# Patient Record
Sex: Male | Born: 1975 | Race: White | Hispanic: No | Marital: Married | Smoking: Former smoker
Health system: Southern US, Community
[De-identification: ages and names within clinical notes are randomized; demographics above are authoritative.]

## PROBLEM LIST (undated history)

## (undated) DIAGNOSIS — F419 Anxiety disorder, unspecified: Secondary | ICD-10-CM

## (undated) HISTORY — PX: WISDOM TOOTH EXTRACTION: SHX21

---

## 2019-10-15 ENCOUNTER — Ambulatory Visit: Admission: EM | Admit: 2019-10-15 | Disposition: A | Discharge status: Home / Self Care | Payer: Self-pay

## 2019-10-15 ENCOUNTER — Ambulatory Visit
Admission: EM | Admit: 2019-10-15 | Discharge: 2019-10-15 | Disposition: A | Discharge status: Home / Self Care | Payer: Federal, State, Local not specified - PPO

## 2019-10-15 DIAGNOSIS — T148XXA Other injury of unspecified body region, initial encounter: Secondary | ICD-10-CM

## 2019-10-15 DIAGNOSIS — S01112A Laceration without foreign body of left eyelid and periocular area, initial encounter: Secondary | ICD-10-CM | POA: Diagnosis not present

## 2019-10-15 DIAGNOSIS — L089 Local infection of the skin and subcutaneous tissue, unspecified: Secondary | ICD-10-CM

## 2019-10-15 HISTORY — DX: Anxiety disorder, unspecified: F41.9

## 2019-10-15 MED ORDER — CEPHALEXIN 500 MG PO CAPS: 500.0000 mg | ORAL_CAPSULE | Freq: Four times a day (QID) | ORAL | 0 refills | Status: AC

## 2019-10-15 NOTE — Discharge Instructions (Addendum)
Take the antibiotic as directed.    Keep your wound clean and dry.  Wash it gently twice a day with soap and water.      Follow up with your primary care provider or return here if you see signs of infection, such as increased pain, redness, pus-like drainage, warmth, fever, chills, or other concerning symptoms.

## 2019-10-15 NOTE — ED Triage Notes (Signed)
Patient reports he was walking into the bathroom at home early Sunday morning when he tripped and hit his left eye on the bathtub.

## 2019-10-15 NOTE — ED Provider Notes (Signed)
Joel Barr    CSN: [id number] Arrival date & time: 10/15/19  1045      History   Chief Complaint Chief Complaint  Patient presents with  . Eye Injury    HPI Joel Barr is a 44 y.o. male.   Patient presents with a laceration over his left eyebrow which occurred on 10/13/2019.  He states he got up in the night to go to the bathroom, tripped, fell and cut his eyebrow.  He denies dizziness, numbness, weakness, headache, changes in vision, eye pain, or other symptoms.  Treatment at home with OTC Steri-Strips applied this morning.  Tetanus UTD.   The history is provided by the patient.    Past Medical History:  Diagnosis Date  . Anxiety     There are no problems to display for this patient.   Past Surgical History:  Procedure Laterality Date  . WISDOM TOOTH EXTRACTION         Home Medications    Prior to Admission medications   Medication Sig Start Date End Date Taking? Authorizing Provider  citalopram (CELEXA) 20 MG tablet Take 20 mg by mouth daily.   Yes [provider]  cephALEXin (KEFLEX) 500 MG capsule Take 1 capsule (500 mg total) by mouth 4 (four) times daily for 7 days. 10/15/19 10/22/19  Mickie Bail, NP    Family History History reviewed. No pertinent family history.  Social History Social History   Tobacco Use  . Smoking status: Former Games developer  . Smokeless tobacco: Former Clinical biochemist  . Vaping Use: Never used  Substance Use Topics  . Alcohol use: Yes    Comment: occasionally  . Drug use: Not on file     Allergies   Patient has no known allergies.   Review of Systems Review of Systems  Constitutional: Negative for chills and fever.  HENT: Negative for ear pain and sore throat.   Eyes: Negative for pain and visual disturbance.  Respiratory: Negative for cough and shortness of breath.   Cardiovascular: Negative for chest pain and palpitations.  Gastrointestinal: Negative for abdominal pain and vomiting.    Genitourinary: Negative for dysuria and hematuria.  Musculoskeletal: Negative for arthralgias and back pain.  Skin: Positive for wound. Negative for color change and rash.  Neurological: Negative for seizures, syncope, weakness and numbness.  All other systems reviewed and are negative.    Physical Exam Triage Vital Signs ED Triage Vitals  Enc Vitals Group     BP 10/15/19 1230 126/87     Pulse Rate 10/15/19 1230 72     Resp 10/15/19 1230 14     Temp 10/15/19 1230 98.2 F (36.8 C)     Temp src --      SpO2 10/15/19 1230 97 %     Weight --      Height --      Head Circumference --      Peak Flow --      Pain Score 10/15/19 1227 1     Pain Loc --      Pain Edu? --      Excl. in GC? --    No data found.  Updated Vital Signs BP 126/87   Pulse 72   Temp 98.2 F (36.8 C)   Resp 14   SpO2 97%   Visual Acuity Right Eye Distance:   Left Eye Distance:   Bilateral Distance:    Right Eye Near:   Left Eye Near:  Bilateral Near:     Physical Exam Vitals and nursing note reviewed.  Constitutional:      General: He is not in acute distress.    Appearance: He is well-developed.  HENT:     Head: Normocephalic and atraumatic.     Mouth/Throat:     Mouth: Mucous membranes are moist.  Eyes:     Extraocular Movements: Extraocular movements intact.     Conjunctiva/sclera: Conjunctivae normal.     Pupils: Pupils are equal, round, and reactive to light.   Cardiovascular:     Rate and Rhythm: Normal rate and regular rhythm.     Heart sounds: No murmur heard.   Pulmonary:     Effort: Pulmonary effort is normal. No respiratory distress.     Breath sounds: Normal breath sounds.  Abdominal:     Palpations: Abdomen is soft.     Tenderness: There is no abdominal tenderness.  Musculoskeletal:        General: Normal range of motion.     Cervical back: Neck supple.  Skin:    General: Skin is warm and dry.     Findings: Bruising and lesion present.     Comments: Bruising  beneath left eye.   Neurological:     General: No focal deficit present.     Mental Status: He is alert and oriented to person, place, and time.     Cranial Nerves: No cranial nerve deficit.     Sensory: No sensory deficit.     Motor: No weakness.     Gait: Gait normal.  Psychiatric:        Mood and Affect: Mood normal.        Behavior: Behavior normal.      UC Treatments / Results  Labs (all labs ordered are listed, but only abnormal results are displayed) Labs Reviewed - No data to display  EKG   Radiology No results found.  Procedures Laceration Repair  Date/Time: 10/15/2019 1:01 PM Performed by: Mickie Bail, NP Authorized by: Mickie Bail, NP   Consent:    Consent obtained:  Verbal   Consent given by:  Patient   Risks discussed:  Infection, poor wound healing and poor cosmetic result Anesthesia (see MAR for exact dosages):    Anesthesia method:  None Laceration details:    Location:  Face   Face location:  L eyebrow   Length (cm):  2   Depth (mm):  3 Repair type:    Repair type:  Simple Exploration:    Wound exploration: entire depth of wound probed and visualized   Treatment:    Area cleansed with:  Saline   Irrigation solution:  Sterile saline   Irrigation method:  Syringe   Visualized foreign bodies/material removed: no   Skin repair:    Repair method:  Steri-Strips   Number of Steri-Strips:  3 Approximation:    Approximation:  Close Post-procedure details:    Dressing:  Open (no dressing)   Patient tolerance of procedure:  Tolerated well, no immediate complications Comments:     Small amount of purulent drainage from wound.    (including critical care time)  Medications Ordered in UC Medications - No data to display  Initial Impression / Assessment and Plan / UC Course  I have reviewed the triage vital signs and the nursing notes.  Pertinent labs & imaging results that were available during my care of the patient were reviewed by me and  considered in my medical decision making (  see chart for details).   Laceration of the left eyebrow.  Wound infection.  Wound cleaned and Steri-Strips applied.  Treating with Keflex.  Wound care instructions and signs of worsening infection discussed with patient.  Instructed patient to return here or follow-up with his PCP if he notes signs of worsening infection.  Tetanus up-to-date.  Patient agrees to plan of care.   Final Clinical Impressions(s) / UC Diagnoses   Final diagnoses:  Laceration of left eyebrow, initial encounter  Wound infection     Discharge Instructions     Take the antibiotic as directed.    Keep your wound clean and dry.  Wash it gently twice a day with soap and water.      Follow up with your primary care provider or return here if you see signs of infection, such as increased pain, redness, pus-like drainage, warmth, fever, chills, or other concerning symptoms.       ED Prescriptions    Medication Sig Dispense Auth. Provider   cephALEXin (KEFLEX) 500 MG capsule Take 1 capsule (500 mg total) by mouth 4 (four) times daily for 7 days. 28 capsule Mickie Bail, NP     PDMP not reviewed this encounter.   Mickie Bail, NP 10/15/19 1313

## 2020-03-02 ENCOUNTER — Other Ambulatory Visit: Payer: Federal, State, Local not specified - PPO

## 2020-05-25 ENCOUNTER — Encounter
Admission: EM | Disposition: A | Discharge status: Home / Self Care | Payer: Self-pay | Source: Home / Self Care | Attending: Emergency Medicine

## 2020-05-25 ENCOUNTER — Observation Stay: Payer: Federal, State, Local not specified - PPO | Admitting: Anesthesiology

## 2020-05-25 ENCOUNTER — Other Ambulatory Visit: Payer: Self-pay

## 2020-05-25 ENCOUNTER — Observation Stay
Admission: EM | Admit: 2020-05-25 | Discharge: 2020-05-26 | Disposition: A | Discharge status: Home / Self Care | Payer: Federal, State, Local not specified - PPO | Attending: General Surgery | Admitting: General Surgery

## 2020-05-25 ENCOUNTER — Emergency Department: Payer: Federal, State, Local not specified - PPO

## 2020-05-25 DIAGNOSIS — Z87891 Personal history of nicotine dependence: Secondary | ICD-10-CM | POA: Insufficient documentation

## 2020-05-25 DIAGNOSIS — F1010 Alcohol abuse, uncomplicated: Secondary | ICD-10-CM

## 2020-05-25 DIAGNOSIS — Z79899 Other long term (current) drug therapy: Secondary | ICD-10-CM | POA: Insufficient documentation

## 2020-05-25 DIAGNOSIS — R1031 Right lower quadrant pain: Secondary | ICD-10-CM | POA: Diagnosis present

## 2020-05-25 DIAGNOSIS — K353 Acute appendicitis with localized peritonitis, without perforation or gangrene: Secondary | ICD-10-CM | POA: Diagnosis not present

## 2020-05-25 DIAGNOSIS — Z20822 Contact with and (suspected) exposure to covid-19: Secondary | ICD-10-CM | POA: Insufficient documentation

## 2020-05-25 HISTORY — PX: XI ROBOTIC LAPAROSCOPIC ASSISTED APPENDECTOMY: SHX6877

## 2020-05-25 LAB — URINALYSIS, COMPLETE (UACMP) WITH MICROSCOPIC
Bacteria, UA: NONE SEEN
Bilirubin Urine: NEGATIVE
Glucose, UA: NEGATIVE mg/dL
Hgb urine dipstick: NEGATIVE
Ketones, ur: NEGATIVE mg/dL
Leukocytes,Ua: NEGATIVE
Nitrite: NEGATIVE
Protein, ur: NEGATIVE mg/dL
Specific Gravity, Urine: 1.03 (ref 1.005–1.030)
Squamous Epithelial / HPF: NONE SEEN (ref 0–5)
pH: 5 (ref 5.0–8.0)

## 2020-05-25 LAB — CBC
HCT: 41.7 % (ref 39.0–52.0)
Hemoglobin: 13.9 g/dL (ref 13.0–17.0)
MCH: 32 pg (ref 26.0–34.0)
MCHC: 33.3 g/dL (ref 30.0–36.0)
MCV: 96.1 fL (ref 80.0–100.0)
Platelets: 175 10*3/uL (ref 150–400)
RBC: 4.34 MIL/uL (ref 4.22–5.81)
RDW: 12.4 % (ref 11.5–15.5)
WBC: 10.4 10*3/uL (ref 4.0–10.5)
nRBC: 0 % (ref 0.0–0.2)

## 2020-05-25 LAB — COMPREHENSIVE METABOLIC PANEL
ALT: 22 U/L (ref 0–44)
AST: 26 U/L (ref 15–41)
Albumin: 4.1 g/dL (ref 3.5–5.0)
Alkaline Phosphatase: 53 U/L (ref 38–126)
Anion gap: 10 (ref 5–15)
BUN: 15 mg/dL (ref 6–20)
CO2: 26 mmol/L (ref 22–32)
Calcium: 8.8 mg/dL — ABNORMAL LOW (ref 8.9–10.3)
Chloride: 101 mmol/L (ref 98–111)
Creatinine, Ser: 0.91 mg/dL (ref 0.61–1.24)
GFR, Estimated: >60 mL/min (ref >60)
Glucose, Bld: 116 mg/dL — ABNORMAL HIGH (ref 70–99)
Potassium: 3.8 mmol/L (ref 3.5–5.1)
Sodium: 137 mmol/L (ref 135–145)
Total Bilirubin: 0.6 mg/dL (ref 0.3–1.2)
Total Protein: 7 g/dL (ref 6.5–8.1)

## 2020-05-25 LAB — LIPASE, BLOOD: Lipase: 29 U/L (ref 11–51)

## 2020-05-25 LAB — RESP PANEL BY RT-PCR (FLU A&B, COVID) ARPGX2
Influenza A by PCR: NEGATIVE
Influenza B by PCR: NEGATIVE
SARS Coronavirus 2 by RT PCR: NEGATIVE

## 2020-05-25 SURGERY — APPENDECTOMY, ROBOT-ASSISTED, LAPAROSCOPIC
Anesthesia: General

## 2020-05-25 MED ORDER — PIPERACILLIN-TAZOBACTAM 3.375 G IVPB
3.3750 g | Freq: Three times a day (TID) | INTRAVENOUS | Status: DC
  Administered 2020-05-25 – 2020-05-26 (×3): 3.375 g via INTRAVENOUS
  Filled 2020-05-25 (×3): qty 50

## 2020-05-25 MED ORDER — SODIUM CHLORIDE 0.9 % IV SOLN: Freq: Once | INTRAVENOUS | Status: DC

## 2020-05-25 MED ORDER — MIDAZOLAM HCL 2 MG/2ML IJ SOLN
INTRAMUSCULAR | Status: AC
  Filled 2020-05-25: qty 2

## 2020-05-25 MED ORDER — CITALOPRAM HYDROBROMIDE 20 MG PO TABS
20.0000 mg | ORAL_TABLET | Freq: Every day | ORAL | Status: DC
  Administered 2020-05-26: 20 mg via ORAL
  Filled 2020-05-25: qty 1

## 2020-05-25 MED ORDER — PROPOFOL 10 MG/ML IV BOLUS
INTRAVENOUS | Status: AC
  Filled 2020-05-25: qty 20

## 2020-05-25 MED ORDER — ONDANSETRON HCL 4 MG/2ML IJ SOLN: 4.0000 mg | Freq: Four times a day (QID) | INTRAMUSCULAR | Status: DC | PRN

## 2020-05-25 MED ORDER — MIDAZOLAM HCL 2 MG/2ML IJ SOLN
INTRAMUSCULAR | Status: DC | PRN
  Administered 2020-05-25: 2 mg via INTRAVENOUS

## 2020-05-25 MED ORDER — MORPHINE SULFATE (PF) 4 MG/ML IV SOLN
4.0000 mg | INTRAVENOUS | Status: DC | PRN
  Administered 2020-05-25: 4 mg via INTRAVENOUS
  Filled 2020-05-25: qty 1

## 2020-05-25 MED ORDER — HYDROCODONE-ACETAMINOPHEN 5-325 MG PO TABS
1.0000 | ORAL_TABLET | ORAL | Status: DC | PRN
  Administered 2020-05-26: 1 via ORAL
  Administered 2020-05-26: 2 via ORAL
  Filled 2020-05-25: qty 2
  Filled 2020-05-25: qty 1

## 2020-05-25 MED ORDER — SODIUM CHLORIDE 0.9 % IV SOLN: INTRAVENOUS | Status: DC

## 2020-05-25 MED ORDER — ONDANSETRON HCL 4 MG/2ML IJ SOLN
4.0000 mg | Freq: Once | INTRAMUSCULAR | Status: AC
  Administered 2020-05-25: 4 mg via INTRAVENOUS
  Filled 2020-05-25: qty 2

## 2020-05-25 MED ORDER — FENTANYL CITRATE (PF) 100 MCG/2ML IJ SOLN
INTRAMUSCULAR | Status: AC
  Filled 2020-05-25: qty 2

## 2020-05-25 MED ORDER — PANTOPRAZOLE SODIUM 40 MG IV SOLR: 40.0000 mg | Freq: Every day | INTRAVENOUS | Status: DC

## 2020-05-25 MED ORDER — MORPHINE SULFATE (PF) 4 MG/ML IV SOLN
4.0000 mg | Freq: Once | INTRAVENOUS | Status: AC
  Administered 2020-05-25: 4 mg via INTRAVENOUS
  Filled 2020-05-25: qty 1

## 2020-05-25 MED ORDER — IOHEXOL 300 MG/ML  SOLN
100.0000 mL | Freq: Once | INTRAMUSCULAR | Status: AC | PRN
  Administered 2020-05-25: 100 mL via INTRAVENOUS

## 2020-05-25 MED ORDER — LACTATED RINGERS IV BOLUS
1000.0000 mL | Freq: Once | INTRAVENOUS | Status: AC
  Administered 2020-05-25: 1000 mL via INTRAVENOUS

## 2020-05-25 MED ORDER — ACETAMINOPHEN 325 MG PO TABS: 650.0000 mg | ORAL_TABLET | Freq: Four times a day (QID) | ORAL | Status: DC | PRN

## 2020-05-25 MED ORDER — BUPIVACAINE-EPINEPHRINE (PF) 0.5% -1:200000 IJ SOLN
INTRAMUSCULAR | Status: AC
  Filled 2020-05-25: qty 30

## 2020-05-25 MED ORDER — ONDANSETRON 4 MG PO TBDP: 4.0000 mg | ORAL_TABLET | Freq: Four times a day (QID) | ORAL | Status: DC | PRN

## 2020-05-25 MED ORDER — ACETAMINOPHEN 650 MG RE SUPP: 650.0000 mg | Freq: Four times a day (QID) | RECTAL | Status: DC | PRN

## 2020-05-25 MED ORDER — ENOXAPARIN SODIUM 40 MG/0.4ML ~~LOC~~ SOLN
40.0000 mg | SUBCUTANEOUS | Status: DC
  Administered 2020-05-26: 40 mg via SUBCUTANEOUS
  Filled 2020-05-25: qty 0.4

## 2020-05-25 SURGICAL SUPPLY — 64 items
BAG INFUSER PRESSURE 100CC (MISCELLANEOUS) IMPLANT
BLADE SURG SZ11 CARB STEEL (BLADE) ×2 IMPLANT
CANISTER SUCT 1200ML W/VALVE (MISCELLANEOUS) IMPLANT
CANNULA REDUC XI 12-8 STAPL (CANNULA) ×1
CANNULA REDUCER 12-8 DVNC XI (CANNULA) ×1 IMPLANT
CHLORAPREP W/TINT 26 (MISCELLANEOUS) ×2 IMPLANT
COVER TIP SHEARS 8 DVNC (MISCELLANEOUS) ×1 IMPLANT
COVER TIP SHEARS 8MM DA VINCI (MISCELLANEOUS) ×1
COVER WAND RF STERILE (DRAPES) ×2 IMPLANT
DEFOGGER SCOPE WARMER CLEARIFY (MISCELLANEOUS) ×2 IMPLANT
DERMABOND ADVANCED (GAUZE/BANDAGES/DRESSINGS) ×1
DERMABOND ADVANCED .7 DNX12 (GAUZE/BANDAGES/DRESSINGS) ×1 IMPLANT
DRAPE ARM DVNC X/XI (DISPOSABLE) ×4 IMPLANT
DRAPE COLUMN DVNC XI (DISPOSABLE) ×1 IMPLANT
DRAPE DA VINCI XI ARM (DISPOSABLE) ×4
DRAPE DA VINCI XI COLUMN (DISPOSABLE) ×1
ELECT REM PT RETURN 9FT ADLT (ELECTROSURGICAL) ×2
ELECTRODE REM PT RTRN 9FT ADLT (ELECTROSURGICAL) ×1 IMPLANT
GLOVE SURG SYN 6.5 ES PF (GLOVE) ×4 IMPLANT
GLOVE SURG UNDER POLY LF SZ7 (GLOVE) ×4 IMPLANT
GOWN STRL REUS W/ TWL LRG LVL3 (GOWN DISPOSABLE) ×3 IMPLANT
GOWN STRL REUS W/TWL LRG LVL3 (GOWN DISPOSABLE) ×3
GRASPER SUT TROCAR 14GX15 (MISCELLANEOUS) ×2 IMPLANT
IRRIGATOR SUCT 8 DISP DVNC XI (IRRIGATION / IRRIGATOR) IMPLANT
IRRIGATOR SUCTION 8MM XI DISP (IRRIGATION / IRRIGATOR)
IV NS 1000ML (IV SOLUTION)
IV NS 1000ML BAXH (IV SOLUTION) IMPLANT
KIT PINK PAD W/HEAD ARE REST (MISCELLANEOUS) ×2
KIT PINK PAD W/HEAD ARM REST (MISCELLANEOUS) ×1 IMPLANT
LABEL OR SOLS (LABEL) IMPLANT
MANIFOLD NEPTUNE II (INSTRUMENTS) ×2 IMPLANT
NEEDLE HYPO 22GX1.5 SAFETY (NEEDLE) ×2 IMPLANT
NEEDLE INSUFFLATION 14GA 120MM (NEEDLE) ×2 IMPLANT
OBTURATOR OPTICAL STANDARD 8MM (TROCAR) ×1
OBTURATOR OPTICAL STND 8 DVNC (TROCAR) ×1
OBTURATOR OPTICALSTD 8 DVNC (TROCAR) ×1 IMPLANT
PACK LAP CHOLECYSTECTOMY (MISCELLANEOUS) ×2 IMPLANT
POUCH SPECIMEN RETRIEVAL 10MM (ENDOMECHANICALS) ×2 IMPLANT
RELOAD STAPLER 2.5X45 WHT DVNC (STAPLE) IMPLANT
RELOAD STAPLER 3.5X45 BLU DVNC (STAPLE) IMPLANT
SEAL CANN UNIV 5-8 DVNC XI (MISCELLANEOUS) ×3 IMPLANT
SEAL XI 5MM-8MM UNIVERSAL (MISCELLANEOUS) ×3
SEALER VESSEL DA VINCI XI (MISCELLANEOUS)
SEALER VESSEL EXT DVNC XI (MISCELLANEOUS) IMPLANT
SET TUBE SMOKE EVAC HIGH FLOW (TUBING) ×2 IMPLANT
SLEEVE SCD COMPRESS KNEE LRG (STOCKING) ×2 IMPLANT
SOLUTION ELECTROLUBE (MISCELLANEOUS) ×2 IMPLANT
STAPLER 45 DA VINCI SURE FORM (STAPLE)
STAPLER 45 SUREFORM DVNC (STAPLE) IMPLANT
STAPLER CANNULA SEAL DVNC XI (STAPLE) IMPLANT
STAPLER CANNULA SEAL XI (STAPLE)
STAPLER RELOAD 2.5X45 WHITE (STAPLE)
STAPLER RELOAD 2.5X45 WHT DVNC (STAPLE)
STAPLER RELOAD 3.5X45 BLU DVNC (STAPLE)
STAPLER RELOAD 3.5X45 BLUE (STAPLE)
SUT MNCRL AB 4-0 PS2 18 (SUTURE) ×2 IMPLANT
SUT VIC AB 2-0 SH 27 (SUTURE) ×1
SUT VIC AB 2-0 SH 27XBRD (SUTURE) ×1 IMPLANT
SUT VIC AB 3-0 SH 27 (SUTURE) ×1
SUT VIC AB 3-0 SH 27X BRD (SUTURE) ×1 IMPLANT
SUT VICRYL 0 AB UR-6 (SUTURE) ×2 IMPLANT
SUT VLOC 90 6 CV-15 VIOLET (SUTURE) ×2 IMPLANT
SYR 30ML LL (SYRINGE) ×2 IMPLANT
TRAY FOLEY MTR SLVR 16FR STAT (SET/KITS/TRAYS/PACK) IMPLANT

## 2020-05-25 NOTE — ED Notes (Signed)
Artelia Laroche RN given report.

## 2020-05-25 NOTE — ED Notes (Signed)
Pt signed consent with this RN as witness. Pt told to remove anything metal, all personal clothing, jewelry, and is in a gown as requested by OR/surgery staff during report. Visitor remains at bedside.

## 2020-05-25 NOTE — H&P (Signed)
SURGICAL CONSULTATION NOTE   HISTORY OF PRESENT ILLNESS (HPI):  45 y.o. male presented to [facility] ED for evaluation of abdominal pain since today at 4 PM. Patient reports started with abdominal pain that localized to the right lower quadrant. Pain does not radiates to other part of the body. Pain aggravated by applying pressure on the right lower quadrant. No alleviating factors. Denies fever.  At the ED he was found with normal WBC count. CT scan of the abdomen shows inflamed appendix. No free air. No abscess. I personally evaluated the images.   Surgery is consulted by Dr. Katrinka Blazing in this context for evaluation and management of acute appendicitis.  PAST MEDICAL HISTORY (PMH):  Past Medical History:  Diagnosis Date  . Anxiety      PAST SURGICAL HISTORY (PSH):  Past Surgical History:  Procedure Laterality Date  . WISDOM TOOTH EXTRACTION       MEDICATIONS:  Prior to Admission medications   Medication Sig Start Date End Date Taking? Authorizing Provider  citalopram (CELEXA) 20 MG tablet Take 20 mg by mouth daily.   Yes [provider]     ALLERGIES:  No Known Allergies   SOCIAL HISTORY:  Social History   Socioeconomic History  . Marital status: Married    Spouse name: Not on file  . Number of children: Not on file  . Years of education: Not on file  . Highest education level: Not on file  Occupational History  . Not on file  Tobacco Use  . Smoking status: Former Games developer  . Smokeless tobacco: Former Clinical biochemist  . Vaping Use: Never used  Substance and Sexual Activity  . Alcohol use: Yes    Comment: occasionally  . Drug use: Not on file  . Sexual activity: Not on file  Other Topics Concern  . Not on file  Social History Narrative  . Not on file   Social Determinants of Health   Financial Resource Strain: Not on file  Food Insecurity: Not on file  Transportation Needs: Not on file  Physical Activity: Not on file  Stress: Not on file  Social  Connections: Not on file  Intimate Partner Violence: Not on file      FAMILY HISTORY:  No family history on file.   REVIEW OF SYSTEMS:  Constitutional: denies weight loss, fever, chills, or sweats  Eyes: denies any other vision changes, history of eye injury  ENT: denies sore throat, hearing problems  Respiratory: denies shortness of breath, wheezing  Cardiovascular: denies chest pain, palpitations  Gastrointestinal: positive abdominal pain, negative nausea and vomiting Genitourinary: denies burning with urination or urinary frequency Musculoskeletal: denies any other joint pains or cramps  Skin: denies any other rashes or skin discolorations  Neurological: denies any other headache, dizziness, weakness  Psychiatric: denies any other depression, anxiety   All other review of systems were negative   VITAL SIGNS:  Temp:  [98.3 F (36.8 C)] 98.3 F (36.8 C) (04/18 1933) Pulse Rate:  [74-81] 74 (04/18 2115) Resp:  [16] 16 (04/18 1933) BP: (136-152)/(78-88) 136/78 (04/18 2030) SpO2:  [96 %-100 %] 99 % (04/18 2115) Weight:  [99.8 kg] 99.8 kg (04/18 1934)       Weight: 99.8 kg     INTAKE/OUTPUT:  This shift: No intake/output data recorded.  Last 2 shifts: @IOLAST2SHIFTS @   PHYSICAL EXAM:  Constitutional:  -- Normal body habitus  -- Awake, alert, and oriented x3  Eyes:  -- Pupils equally round and reactive to  light  -- No scleral icterus  Ear, nose, and throat:  -- No jugular venous distension  Pulmonary:  -- No crackles  -- Equal breath sounds bilaterally -- Breathing non-labored at rest Cardiovascular:  -- S1, S2 present  -- No pericardial rubs Gastrointestinal:  -- Abdomen soft, tender in right lower quadrant, non-distended, no guarding or rebound tenderness -- No abdominal masses appreciated, pulsatile or otherwise  Musculoskeletal and Integumentary:  -- Wounds or skin discoloration: None appreciated -- Extremities: B/L UE and LE FROM, hands and feet warm, no  edema  Neurologic:  -- Motor function: intact and symmetric -- Sensation: intact and symmetric   Labs:  CBC Latest Ref Rng & Units 05/25/2020  WBC 4.0 - 10.5 K/uL 10.4  Hemoglobin 13.0 - 17.0 g/dL 17.4  Hematocrit 94.4 - 52.0 % 41.7  Platelets 150 - 400 K/uL 175   CMP Latest Ref Rng & Units 05/25/2020  Glucose 70 - 99 mg/dL 967(R)  BUN 6 - 20 mg/dL 15  Creatinine 9.16 - 3.84 mg/dL 6.65  Sodium 993 - 570 mmol/L 137  Potassium 3.5 - 5.1 mmol/L 3.8  Chloride 98 - 111 mmol/L 101  CO2 22 - 32 mmol/L 26  Calcium 8.9 - 10.3 mg/dL 1.7(B)  Total Protein 6.5 - 8.1 g/dL 7.0  Total Bilirubin 0.3 - 1.2 mg/dL 0.6  Alkaline Phos 38 - 126 U/L 53  AST 15 - 41 U/L 26  ALT 0 - 44 U/L 22     Imaging studies:  EXAM: CT ABDOMEN AND PELVIS WITH CONTRAST  TECHNIQUE: Multidetector CT imaging of the abdomen and pelvis was performed using the standard protocol following bolus administration of intravenous contrast.  CONTRAST:  OMNIPAQUE IOHEXOL 300 MG/ML  SOLN  COMPARISON:  None.  FINDINGS: Lower chest: Lung bases are clear. No effusions. Heart is normal size.  Hepatobiliary: No focal hepatic abnormality. Gallbladder unremarkable.  Pancreas: No focal abnormality or ductal dilatation.  Spleen: No focal abnormality.  Normal size.  Adrenals/Urinary Tract: No adrenal abnormality. No focal renal abnormality. No stones or hydronephrosis. Urinary bladder is unremarkable.  Stomach/Bowel: Stomach, large and small bowel grossly unremarkable. Appendix is dilated, measuring up to 9 mm. Surrounding inflammation and fluid.  Vascular/Lymphatic: No evidence of aneurysm or adenopathy.  Reproductive: No visible focal abnormality.  Other: Small amount of free fluid in the cul-de-sac.  No free air.  Musculoskeletal: No acute bony abnormality.  IMPRESSION: Mildly distended appendix with surrounding inflammation and fluid compatible with acute appendicitis. Small amount of  free fluid in the pelvis.  These results were called by telephone at the time of interpretation on 05/25/2020 at 9:12 pm to provider [facility] , who verbally acknowledged these results.   Electronically Signed   By: Charlett Nose M.D.   On: 05/25/2020 21:12  Assessment/Plan:  45 y.o. male with acute appendicitis, complicated by pertinent comorbidities including anxiety.  Patient with history, physical exam and images consistent with acute appendicitis. Patient oriented about diagnosis and surgical management as treatment. Patient oriented about goals of surgery and its risk including: bowel injury, infection, abscess, bleeding, leak from cecum, intestinal adhesions, bowel obstruction, fistula, injury to the ureter among others.  Patient understood and agreed to proceed with surgery. Will admit patient, already started on antibiotic therapy, will give IV hydration since patient is NPO and schedule to OR.   Will continue his home meds for anxiety.   Gae Gallop, MD

## 2020-05-25 NOTE — ED Notes (Signed)
Pt reports last ate around 11am today.

## 2020-05-25 NOTE — ED Notes (Signed)
Pt reports around 4pm today started having RLQ pain and tenderness, chills, and nausea. Denies nausea currently. Denies back pain, fever, burning or changes with urination. Reports took 2 dulcolax to help with BM. Abd soft. Skin dry, resp reg/unlabored, laying calmly in bed. Family at bedside.

## 2020-05-25 NOTE — ED Triage Notes (Signed)
Pt presents via POV c/o RLQ abd pain since 1600. Tenderness and guarding to RLQ

## 2020-05-25 NOTE — ED Notes (Signed)
Pt given urinal and educated urine sample needed.

## 2020-05-25 NOTE — ED Notes (Signed)
Called floor to see which RN will be assigned to room 212 since no one currently assigned on EHR.

## 2020-05-25 NOTE — Anesthesia Preprocedure Evaluation (Signed)
Anesthesia Evaluation  Patient identified by MRN, date of birth, ID band Patient awake    Reviewed: Allergy & Precautions, NPO status , Patient's Chart, lab work & pertinent test results  History of Anesthesia Complications Negative for: history of anesthetic complications  Airway Mallampati: III       Dental   Pulmonary neg sleep apnea, neg COPD, Not current smoker, former smoker,           Cardiovascular (-) hypertension(-) Past MI and (-) CHF (-) dysrhythmias (-) Valvular Problems/Murmurs     Neuro/Psych neg Seizures Anxiety    GI/Hepatic Neg liver ROS, GERD  Medicated and Controlled,  Endo/Other  neg diabetes  Renal/GU negative Renal ROS     Musculoskeletal   Abdominal   Peds  Hematology   Anesthesia Other Findings   Reproductive/Obstetrics                             Anesthesia Physical Anesthesia Plan  ASA: II and emergent  Anesthesia Plan: General   Post-op Pain Management:    Induction: Intravenous  PONV Risk Score and Plan: Ondansetron and Dexamethasone  Airway Management Planned: Oral ETT  Additional Equipment:   Intra-op Plan:   Post-operative Plan:   Informed Consent: I have reviewed the patients History and Physical, chart, labs and discussed the procedure including the risks, benefits and alternatives for the proposed anesthesia with the patient or authorized representative who has indicated his/her understanding and acceptance.       Plan Discussed with:   Anesthesia Plan Comments:         Anesthesia Quick Evaluation

## 2020-05-25 NOTE — ED Provider Notes (Signed)
[facility] Emergency Department Provider Note  ____________________________________________   Event Date/Time   First MD Initiated Contact with Patient 05/25/20 1951     (approximate)  I have reviewed the triage vital signs and the nursing notes.   HISTORY  Chief Complaint Abdominal Pain   HPI Joel Barr is a 45 y.o. male past medical history of anxiety who presents for assessment of acute onset of right lower abdominal pain started around 11 AM today.  Patient Joel Barr is a large but denies any vomiting, diarrhea, burning with urination, blood in his urine, back pain, chest pain, cough, shortness of breath, rash, headache or earache, sore throat or any other acute associated symptoms.  No prior similar episodes.  No clear alleviating aggravating factors.  No other acute concerns at this time.  Denies any significant NSAID or illicit drug use.  Does endorse drinking approximately 6-8 beers per day.         Past Medical History:  Diagnosis Date  . Anxiety     Patient Active Problem List   Diagnosis Date Noted  . Acute appendicitis with localized peritonitis 05/25/2020    Past Surgical History:  Procedure Laterality Date  . WISDOM TOOTH EXTRACTION      Prior to Admission medications   Medication Sig Start Date End Date Taking? Authorizing Provider  citalopram (CELEXA) 20 MG tablet Take 20 mg by mouth daily.   Yes [provider]    Allergies Patient has no known allergies.  No family history on file.  Social History Social History   Tobacco Use  . Smoking status: Former Games developer  . Smokeless tobacco: Former Clinical biochemist  . Vaping Use: Never used  Substance Use Topics  . Alcohol use: Yes    Comment: occasionally    Review of Systems  Review of Systems  Constitutional: Negative for chills and fever.  HENT: Negative for sore throat.   Eyes: Negative for pain.  Respiratory: Negative for cough and stridor.    Cardiovascular: Negative for chest pain.  Gastrointestinal: Positive for abdominal pain and nausea. Negative for vomiting.  Genitourinary: Negative for dysuria.  Musculoskeletal: Negative for myalgias.  Skin: Negative for rash.  Neurological: Negative for seizures, loss of consciousness and headaches.  Psychiatric/Behavioral: Negative for suicidal ideas.  All other systems reviewed and are negative.     ____________________________________________   PHYSICAL EXAM:  VITAL SIGNS: ED Triage Vitals  Enc Vitals Group     BP 05/25/20 1933 (!) 152/85     Pulse Rate 05/25/20 1933 78     Resp 05/25/20 1933 16     Temp 05/25/20 1933 98.3 F (36.8 C)     Temp Source 05/25/20 1933 Oral     SpO2 05/25/20 1933 100 %     Weight 05/25/20 1934 220 lb (99.8 kg)     Height --      Head Circumference --      Peak Flow --      Pain Score 05/25/20 1933 8     Pain Loc --      Pain Edu? --      Excl. in GC? --    Vitals:   05/25/20 2045 05/25/20 2115  BP:    Pulse: 79 74  Resp:    Temp:    SpO2: 99% 99%   Physical Exam Vitals and nursing note reviewed.  Constitutional:      Appearance: He is well-developed.  HENT:     Head: Normocephalic  and atraumatic.  Eyes:     Conjunctiva/sclera: Conjunctivae normal.  Cardiovascular:     Rate and Rhythm: Normal rate and regular rhythm.     Heart sounds: No murmur heard.   Pulmonary:     Effort: Pulmonary effort is normal. No respiratory distress.     Breath sounds: Normal breath sounds.  Abdominal:     Palpations: Abdomen is soft.     Tenderness: There is abdominal tenderness in the right lower quadrant. There is no right CVA tenderness or left CVA tenderness.  Musculoskeletal:     Cervical back: Neck supple.  Skin:    General: Skin is warm and dry.     Capillary Refill: Capillary refill takes less than 2 seconds.  Neurological:     Mental Status: He is alert and oriented to person, place, and time.  Psychiatric:        Mood and  Affect: Mood normal.      ____________________________________________   LABS (all labs ordered are listed, but only abnormal results are displayed)  Labs Reviewed  COMPREHENSIVE METABOLIC PANEL - Abnormal; Notable for the following components:      Result Value   Glucose, Bld 116 (*)    Calcium 8.8 (*)    All other components within normal limits  RESP PANEL BY RT-PCR (FLU A&B, COVID) ARPGX2  LIPASE, BLOOD  CBC  URINALYSIS, COMPLETE (UACMP) WITH MICROSCOPIC  HIV ANTIBODY (ROUTINE TESTING W REFLEX)   ____________________________________________  EKG ____________________________________________  RADIOLOGY  ED MD interpretation: Evidence of acute uncomplicated nonperforated appendicitis.  No evidence of diverticulitis, kidney stone, pyelonephritis, cholecystitis pancreatitis or other clear acute abdominal pelvic process.   Official radiology report(s): CT ABDOMEN PELVIS W CONTRAST  Result Date: 05/25/2020 CLINICAL DATA:  Right lower quadrant pain EXAM: CT ABDOMEN AND PELVIS WITH CONTRAST TECHNIQUE: Multidetector CT imaging of the abdomen and pelvis was performed using the standard protocol following bolus administration of intravenous contrast. CONTRAST:  OMNIPAQUE IOHEXOL 300 MG/ML  SOLN COMPARISON:  None. FINDINGS: Lower chest: Lung bases are clear. No effusions. Heart is normal size. Hepatobiliary: No focal hepatic abnormality. Gallbladder unremarkable. Pancreas: No focal abnormality or ductal dilatation. Spleen: No focal abnormality.  Normal size. Adrenals/Urinary Tract: No adrenal abnormality. No focal renal abnormality. No stones or hydronephrosis. Urinary bladder is unremarkable. Stomach/Bowel: Stomach, large and small bowel grossly unremarkable. Appendix is dilated, measuring up to 9 mm. Surrounding inflammation and fluid. Vascular/Lymphatic: No evidence of aneurysm or adenopathy. Reproductive: No visible focal abnormality. Other: Small amount of free fluid in the  cul-de-sac.  No free air. Musculoskeletal: No acute bony abnormality. IMPRESSION: Mildly distended appendix with surrounding inflammation and fluid compatible with acute appendicitis. Small amount of free fluid in the pelvis. These results were called by telephone at the time of interpretation on 05/25/2020 at 9:12 pm to provider [facility] , who verbally acknowledged these results. Electronically Signed   By: Charlett Nose M.D.   On: 05/25/2020 21:12    ____________________________________________   PROCEDURES  Procedure(s) performed (including Critical Care):  .1-3 Lead EKG Interpretation Performed by: Gilles Chiquito, MD Authorized by: Gilles Chiquito, MD     Interpretation: normal     ECG rate assessment: normal     Rhythm: sinus rhythm     Ectopy: none     Conduction: normal       ____________________________________________   INITIAL IMPRESSION / ASSESSMENT AND PLAN / ED COURSE      Patient presents with above-stated exam for assessment  of some right lower quadrant pain associate with some nausea that began acutely earlier today.  On arrival he is afebrile and hemodynamically stable.  He has tenderness right lower quadrant.  Differential includes appendicitis, diverticulitis, kidney stone, pancreatitis, cholecystitis and enteritis.  CT abdomen pelvis markable for: Evidence of acute uncomplicated nonperforated appendicitis.  No evidence of diverticulitis, kidney stone, pyelonephritis, cholecystitis pancreatitis or other clear acute abdominal pelvic process.   Lipase not consistent with acute pancreatitis.  CMP shows no significant electrolyte or metabolic derangements.  CBC is unremarkable.   Patient given IV fluids and analgesia and antiemetics.  Discussed with on-call surgeon Dr. Maia Plan who will admit to surgery service for further evaluation and management.      ____________________________________________   FINAL CLINICAL IMPRESSION(S) / ED  DIAGNOSES  Final diagnoses:  Acute appendicitis with localized peritonitis, without perforation, abscess, or gangrene    Medications  citalopram (CELEXA) tablet 20 mg (has no administration in time range)  enoxaparin (LOVENOX) injection 40 mg (has no administration in time range)  0.9 %  sodium chloride infusion (has no administration in time range)  piperacillin-tazobactam (ZOSYN) IVPB 3.375 g (has no administration in time range)  acetaminophen (TYLENOL) tablet 650 mg (has no administration in time range)    Or  acetaminophen (TYLENOL) suppository 650 mg (has no administration in time range)  HYDROcodone-acetaminophen (NORCO/VICODIN) 5-325 MG per tablet 1-2 tablet (has no administration in time range)  morphine 4 MG/ML injection 4 mg (has no administration in time range)  ondansetron (ZOFRAN-ODT) disintegrating tablet 4 mg (has no administration in time range)    Or  ondansetron (ZOFRAN) injection 4 mg (has no administration in time range)  pantoprazole (PROTONIX) injection 40 mg (has no administration in time range)  morphine 4 MG/ML injection 4 mg (4 mg Intravenous Given 05/25/20 2005)  lactated ringers bolus 1,000 mL (0 mLs Intravenous Stopped 05/25/20 2122)  ondansetron (ZOFRAN) injection 4 mg (4 mg Intravenous Given 05/25/20 2003)  iohexol (OMNIPAQUE) 300 MG/ML solution 100 mL (100 mLs Intravenous Contrast Given 05/25/20 2049)     ED Discharge Orders    None       Note:  This document was prepared using Dragon voice recognition software and may include unintentional dictation errors.   Gilles Chiquito, MD 05/25/20 2201

## 2020-05-25 NOTE — ED Notes (Signed)
Transport request placed to go to floor since surgery not yet ready for pt.

## 2020-05-25 NOTE — ED Notes (Signed)
Surgeon at bedside. Pt reports he had covid in late-January. Asked if surgeon would still like covid swab completed. Pt reminded urine sample needed; pt agrees to attempt to urinate soon. Urinal attached to stretcher rail. Rail up. Call bell within reach. Wife at bedside.

## 2020-05-25 NOTE — ED Notes (Signed)
Pt reminded pain meds available if needed. Pt declined. Surgeon checking in with team to determine if covid swab needs to be completed. States will get back to this RN if needed.

## 2020-05-26 ENCOUNTER — Encounter: Payer: Self-pay | Admitting: General Surgery

## 2020-05-26 LAB — HIV ANTIBODY (ROUTINE TESTING W REFLEX): HIV Screen 4th Generation wRfx: NONREACTIVE

## 2020-05-26 MED ORDER — FENTANYL CITRATE (PF) 100 MCG/2ML IJ SOLN
INTRAMUSCULAR | Status: DC | PRN
  Administered 2020-05-26: 50 ug via INTRAVENOUS
  Administered 2020-05-26: 100 ug via INTRAVENOUS
  Administered 2020-05-26 (×2): 50 ug via INTRAVENOUS

## 2020-05-26 MED ORDER — SUGAMMADEX SODIUM 200 MG/2ML IV SOLN
INTRAVENOUS | Status: DC | PRN
  Administered 2020-05-26: 220 mg via INTRAVENOUS

## 2020-05-26 MED ORDER — ACETAMINOPHEN 10 MG/ML IV SOLN
INTRAVENOUS | Status: DC | PRN
  Administered 2020-05-26: 1000 mg via INTRAVENOUS

## 2020-05-26 MED ORDER — KETAMINE HCL 50 MG/5ML IJ SOSY
PREFILLED_SYRINGE | INTRAMUSCULAR | Status: AC
  Filled 2020-05-26: qty 5

## 2020-05-26 MED ORDER — EPHEDRINE SULFATE 50 MG/ML IJ SOLN
INTRAMUSCULAR | Status: DC | PRN
  Administered 2020-05-26 (×2): 10 mg via INTRAVENOUS

## 2020-05-26 MED ORDER — HYDROCODONE-ACETAMINOPHEN 5-325 MG PO TABS: 1.0000 | ORAL_TABLET | ORAL | 0 refills | Status: AC | PRN

## 2020-05-26 MED ORDER — ACETAMINOPHEN 10 MG/ML IV SOLN
INTRAVENOUS | Status: AC
  Filled 2020-05-26: qty 100

## 2020-05-26 MED ORDER — DEXMEDETOMIDINE (PRECEDEX) IN NS 20 MCG/5ML (4 MCG/ML) IV SYRINGE
PREFILLED_SYRINGE | INTRAVENOUS | Status: DC | PRN
  Administered 2020-05-26: 4 ug via INTRAVENOUS
  Administered 2020-05-26: 8 ug via INTRAVENOUS
  Administered 2020-05-26 (×2): 4 ug via INTRAVENOUS

## 2020-05-26 MED ORDER — PROPOFOL 10 MG/ML IV BOLUS
INTRAVENOUS | Status: DC | PRN
  Administered 2020-05-26: 100 mg via INTRAVENOUS

## 2020-05-26 MED ORDER — LACTATED RINGERS IV SOLN: INTRAVENOUS | Status: DC | PRN

## 2020-05-26 MED ORDER — DEXAMETHASONE SODIUM PHOSPHATE 10 MG/ML IJ SOLN
INTRAMUSCULAR | Status: DC | PRN
  Administered 2020-05-26: 10 mg via INTRAVENOUS

## 2020-05-26 MED ORDER — FENTANYL CITRATE (PF) 100 MCG/2ML IJ SOLN
INTRAMUSCULAR | Status: AC
  Administered 2020-05-26: 25 ug via INTRAVENOUS
  Filled 2020-05-26: qty 2

## 2020-05-26 MED ORDER — BUPIVACAINE-EPINEPHRINE (PF) 0.5% -1:200000 IJ SOLN
INTRAMUSCULAR | Status: DC | PRN
  Administered 2020-05-26: 30 mL via PERINEURAL

## 2020-05-26 MED ORDER — ONDANSETRON HCL 4 MG/2ML IJ SOLN
INTRAMUSCULAR | Status: DC | PRN
  Administered 2020-05-26: 4 mg via INTRAVENOUS

## 2020-05-26 MED ORDER — ONDANSETRON HCL 4 MG/2ML IJ SOLN: 4.0000 mg | Freq: Once | INTRAMUSCULAR | Status: DC | PRN

## 2020-05-26 MED ORDER — KETAMINE HCL 10 MG/ML IJ SOLN
INTRAMUSCULAR | Status: DC | PRN
  Administered 2020-05-26 (×3): 10 mg via INTRAVENOUS

## 2020-05-26 MED ORDER — FENTANYL CITRATE (PF) 100 MCG/2ML IJ SOLN
INTRAMUSCULAR | Status: AC
  Filled 2020-05-26: qty 2

## 2020-05-26 MED ORDER — FENTANYL CITRATE (PF) 100 MCG/2ML IJ SOLN
25.0000 ug | INTRAMUSCULAR | Status: DC | PRN
  Administered 2020-05-26 (×3): 25 ug via INTRAVENOUS

## 2020-05-26 MED ORDER — ROCURONIUM BROMIDE 100 MG/10ML IV SOLN
INTRAVENOUS | Status: DC | PRN
  Administered 2020-05-26: 40 mg via INTRAVENOUS
  Administered 2020-05-26: 30 mg via INTRAVENOUS

## 2020-05-26 NOTE — Discharge Summary (Signed)
  Patient ID: Joel Barr MRN: [id number] DOB/AGE: 1975/05/24 45 y.o.  Admit date: 05/25/2020 Discharge date: 05/26/2020   Discharge Diagnoses:  Active Problems:   Acute appendicitis with localized peritonitis   Procedures: Robotic assisted laparoscopic appendectomy   Hospital Course: Patient with acute appendicitis. Underwent appendectomy and tolerated well. Today ambulating, pain controlled, wounds are healing well, tolerating diet.   Physical Exam Cardiovascular:     Rate and Rhythm: Normal rate and regular rhythm.  Pulmonary:     Effort: Pulmonary effort is normal.  Abdominal:     General: Abdomen is flat. Bowel sounds are normal.     Palpations: Abdomen is soft.  Neurological:     Mental Status: He is alert and oriented to person, place, and time.      Consults: None  Disposition: Discharge disposition: 01-Home or Self Care       Discharge Instructions    Diet - low sodium heart healthy   Complete by: As directed      Allergies as of 05/26/2020   No Known Allergies     Medication List    TAKE these medications   cetirizine 10 MG tablet Commonly known as: ZYRTEC Take 10 mg by mouth daily.   citalopram 20 MG tablet Commonly known as: CELEXA Take 20 mg by mouth daily.   HYDROcodone-acetaminophen 5-325 MG tablet Commonly known as: Norco Take 1 tablet by mouth every 4 (four) hours as needed for up to 3 days for moderate pain.   omeprazole 20 MG tablet Commonly known as: PRILOSEC OTC Take 20 mg by mouth daily.       Follow-up Information    Carolan Shiver, MD Follow up in 2 week(s).   Specialty: General Surgery Contact information: [address] [place] [address] [phone]

## 2020-05-26 NOTE — Plan of Care (Signed)

## 2020-05-26 NOTE — Op Note (Signed)
Pre-op Diagnosis: Acute appendicitis   Post op Diagnosis: Acute appenditicis  Procedure: Robotic assisted laparoscopic appendectomy.  Anesthesia: GETA  Surgeon: Carolan Shiver, MD, FACS  Wound Classification: clean contaminated  Specimen: Appendix  Complications: None  Estimated Blood Loss: 3 mL   Indications: Patient is a 45 y.o. male  presented with right lower quadrant pain. CT scan shows acute appendicitis.     FIndings: 1.  Irritated appendix with appendicolith   2. No peri-appendiceal abscess or phlegmon 3. Normal anatomy 4. Adequate hemostasis.   Description of procedure: The patient was placed on the operating table in the supine position. General anesthesia was induced. A time-out was completed verifying correct patient, procedure, site, positioning, and implant(s) and/or special equipment prior to beginning this procedure. The abdomen was prepped and draped in the usual sterile fashion.   Palmer's point located and Veress needle was inserted.  After confirming 2 clicks and a positive saline drop test, gas insufflation was initiated until the abdominal pressure was measured at 15 mmHg.  Afterwards, the Veress needle was removed and a 8 mm port was placed in left upper quadrant area using Optiview technique.  After local was infused, 3 additional incision on the left abdominal wall were made 5 cm apart.  An 12 mm port and two other 8 mm ports were placed under direct visualization.  No injuries from trocar placements were noted.  The table was placed in the Trendelenburg position with the right side elevated.  With the use of Tip up grasper, Fenestrated Bipolar and Scissors, an inflamed appendix was identified and elevated.  Window created at base of appendix in the mesentery.    Mesoappendix was divided with fenestrated bipolar and monopolar scissors. The appendix was doubly ligated with 2-0 Vicryl and divided with scissors.   The appendiceal stump was examined and  hemostasis noted. Purse string was done around the base of appendix orifice with 3-0 V-lock. No other pathology was identified within pelvis. The 12 mm trocar removed and port site closed with PMI using 0 vicryl under direct vision. Remaining trocars were removed under direct vision. No bleeding was noted.The abdomen was allowed to collapse.  All skin incisions then closed with subcuticular sutures Monocryl 4-0.  Wounds then dressed with dermabond.  The patient tolerated the procedure well, awakened from anesthesia and was taken to the postanesthesia care unit in satisfactory condition.  Sponge count and instrument count correct at the end of the procedure.

## 2020-05-26 NOTE — Transfer of Care (Signed)
Immediate Anesthesia Transfer of Care Note  Patient: Joel Barr  Procedure(s) Performed: XI ROBOTIC LAPAROSCOPIC ASSISTED APPENDECTOMY (N/A )  Patient Location: PACU  Anesthesia Type:General  Level of Consciousness: awake, alert , oriented and patient cooperative  Airway & Oxygen Therapy: Patient Spontanous Breathing  Post-op Assessment: Report given to RN and Post -op Vital signs reviewed and stable  Post vital signs: Reviewed and stable  Last Vitals:  Vitals Value Taken Time  BP 117/68 05/26/20 0130  Temp    Pulse 90 05/26/20 0131  Resp 19 05/26/20 0131  SpO2 95 % 05/26/20 0131  Vitals shown include unvalidated device data.  Last Pain:  Vitals:   05/25/20 2253  TempSrc:   PainSc: 6          Complications: No complications documented.

## 2020-05-26 NOTE — Progress Notes (Signed)
Patient was able to ambulate without any complication. We walked almost 600 feet. No complains of pain.

## 2020-05-26 NOTE — Discharge Instructions (Signed)

## 2020-05-26 NOTE — Plan of Care (Signed)
  Problem: Education: Goal: Knowledge of General Education information will improve Description: Including pain rating scale, medication(s)/side effects and non-pharmacologic comfort measures 05/26/2020 1501 by Gerarda Gunther, RN Outcome: Adequate for Discharge 05/26/2020 1038 by Gerarda Gunther, RN Outcome: Progressing   Problem: Health Behavior/Discharge Planning: Goal: Ability to manage health-related needs will improve 05/26/2020 1501 by Gerarda Gunther, RN Outcome: Adequate for Discharge 05/26/2020 1038 by Gerarda Gunther, RN Outcome: Progressing   Problem: Clinical Measurements: Goal: Ability to maintain clinical measurements within normal limits will improve 05/26/2020 1501 by Gerarda Gunther, RN Outcome: Adequate for Discharge 05/26/2020 1038 by Gerarda Gunther, RN Outcome: Progressing Goal: Will remain free from infection 05/26/2020 1501 by Gerarda Gunther, RN Outcome: Adequate for Discharge 05/26/2020 1038 by Gerarda Gunther, RN Outcome: Progressing Goal: Diagnostic test results will improve 05/26/2020 1501 by Gerarda Gunther, RN Outcome: Adequate for Discharge 05/26/2020 1038 by Gerarda Gunther, RN Outcome: Progressing Goal: Respiratory complications will improve 05/26/2020 1501 by Gerarda Gunther, RN Outcome: Adequate for Discharge 05/26/2020 1038 by Gerarda Gunther, RN Outcome: Progressing Goal: Cardiovascular complication will be avoided 05/26/2020 1501 by Gerarda Gunther, RN Outcome: Adequate for Discharge 05/26/2020 1038 by Gerarda Gunther, RN Outcome: Progressing   Problem: Activity: Goal: Risk for activity intolerance will decrease 05/26/2020 1501 by Gerarda Gunther, RN Outcome: Adequate for Discharge 05/26/2020 1038 by Gerarda Gunther, RN Outcome: Progressing   Problem: Nutrition: Goal: Adequate nutrition will be maintained 05/26/2020 1501 by Gerarda Gunther, RN Outcome: Adequate for Discharge 05/26/2020 1038 by Gerarda Gunther, RN Outcome: Progressing   Problem:  Coping: Goal: Level of anxiety will decrease 05/26/2020 1501 by Gerarda Gunther, RN Outcome: Adequate for Discharge 05/26/2020 1038 by Gerarda Gunther, RN Outcome: Progressing   Problem: Elimination: Goal: Will not experience complications related to bowel motility 05/26/2020 1501 by Gerarda Gunther, RN Outcome: Adequate for Discharge 05/26/2020 1038 by Gerarda Gunther, RN Outcome: Progressing Goal: Will not experience complications related to urinary retention 05/26/2020 1501 by Gerarda Gunther, RN Outcome: Adequate for Discharge 05/26/2020 1038 by Gerarda Gunther, RN Outcome: Progressing   Problem: Pain Managment: Goal: General experience of comfort will improve 05/26/2020 1501 by Gerarda Gunther, RN Outcome: Adequate for Discharge 05/26/2020 1038 by Gerarda Gunther, RN Outcome: Progressing   Problem: Safety: Goal: Ability to remain free from injury will improve 05/26/2020 1501 by Gerarda Gunther, RN Outcome: Adequate for Discharge 05/26/2020 1038 by Gerarda Gunther, RN Outcome: Progressing   Problem: Skin Integrity: Goal: Risk for impaired skin integrity will decrease 05/26/2020 1501 by Gerarda Gunther, RN Outcome: Adequate for Discharge 05/26/2020 1038 by Gerarda Gunther, RN Outcome: Progressing   Problem: Education: Goal: Required Educational Video(s) 05/26/2020 1501 by Gerarda Gunther, RN Outcome: Adequate for Discharge 05/26/2020 1038 by Gerarda Gunther, RN Outcome: Progressing   Problem: Clinical Measurements: Goal: Postoperative complications will be avoided or minimized 05/26/2020 1501 by Gerarda Gunther, RN Outcome: Adequate for Discharge 05/26/2020 1038 by Gerarda Gunther, RN Outcome: Progressing   Problem: Skin Integrity: Goal: Demonstration of wound healing without infection will improve 05/26/2020 1501 by Gerarda Gunther, RN Outcome: Adequate for Discharge 05/26/2020 1038 by Gerarda Gunther, RN Outcome: Progressing

## 2020-05-26 NOTE — Anesthesia Postprocedure Evaluation (Signed)
Anesthesia Post Note  Patient: Joel Barr  Procedure(s) Performed: XI ROBOTIC LAPAROSCOPIC ASSISTED APPENDECTOMY (N/A )  Patient location during evaluation: PACU Anesthesia Type: General Level of consciousness: awake and alert Pain management: pain level controlled Vital Signs Assessment: post-procedure vital signs reviewed and stable Respiratory status: spontaneous breathing and respiratory function stable Cardiovascular status: stable Anesthetic complications: no   No complications documented.   Last Vitals:  Vitals:   05/26/20 0139 05/26/20 0145  BP:  116/73  Pulse:  81  Resp:  16  Temp:    SpO2: 90% 96%    Last Pain:  Vitals:   05/26/20 0145  TempSrc:   PainSc: 4                  [name]

## 2020-05-26 NOTE — Anesthesia Procedure Notes (Signed)
Procedure Name: Intubation Date/Time: 05/26/2020 12:03 AM Performed by: Waldo Laine, CRNA Pre-anesthesia Checklist: Patient identified, Patient being monitored, Timeout performed, Emergency Drugs available and Suction available Patient Re-evaluated:Patient Re-evaluated prior to induction Oxygen Delivery Method: Circle system utilized Preoxygenation: Pre-oxygenation with 100% oxygen Induction Type: IV induction Ventilation: Mask ventilation without difficulty Laryngoscope Size: 3 and McGraph Grade View: Grade I Tube type: Oral Tube size: 7.5 mm Number of attempts: 1 Airway Equipment and Method: Stylet Placement Confirmation: ETT inserted through vocal cords under direct vision,  positive ETCO2 and breath sounds checked- equal and bilateral Secured at: 22 cm Tube secured with: Tape Dental Injury: Teeth and Oropharynx as per pre-operative assessment

## 2020-05-27 ENCOUNTER — Other Ambulatory Visit: Payer: Self-pay | Admitting: General Surgery

## 2020-05-27 LAB — SURGICAL PATHOLOGY

## 2021-08-09 IMAGING — CT CT ABD-PELV W/ CM
2 of 5 series · 16 of 46 positions shown, 18 images · IV contrast (APPLIED)
Comparison: None.

CLINICAL DATA: Right lower quadrant pain

EXAM:
CT ABDOMEN AND PELVIS WITH CONTRAST
TECHNIQUE: Multidetector CT imaging of the abdomen and pelvis was performed
using the standard protocol following bolus administration of
intravenous contrast.
CONTRAST:  100mL OMNIPAQUE IOHEXOL 300 MG/ML  SOLN

[Series 2: routine abd/pel with · axial · 0.82mm/px · z∈[-540,-95]mm · 13 of 101 slices shown, 15 images]
[im 6/101  soft-tissue]
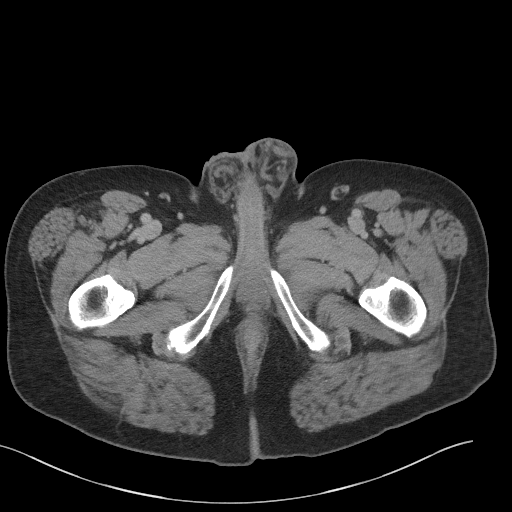
[im 6/101  bone]
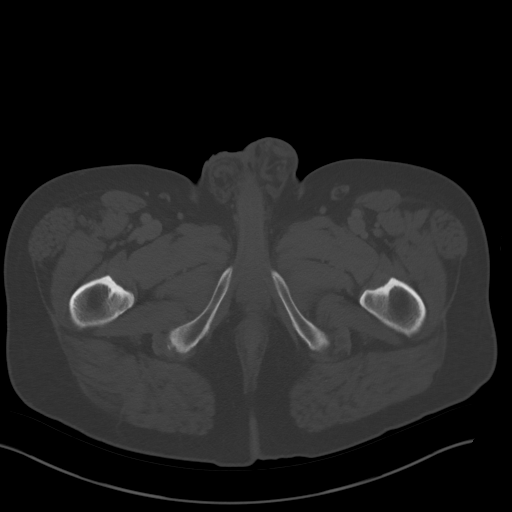
[im 12/101  soft-tissue]
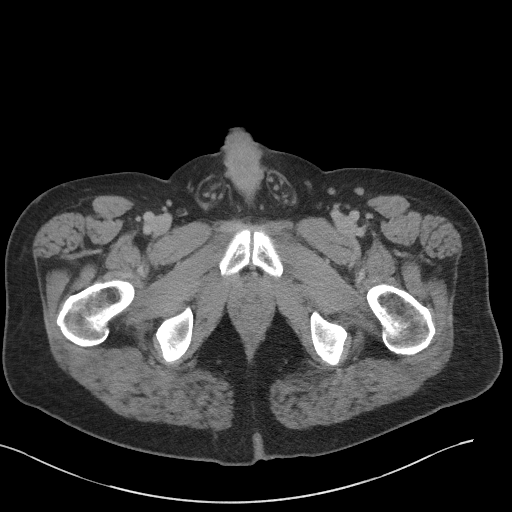
[im 23/101  soft-tissue]
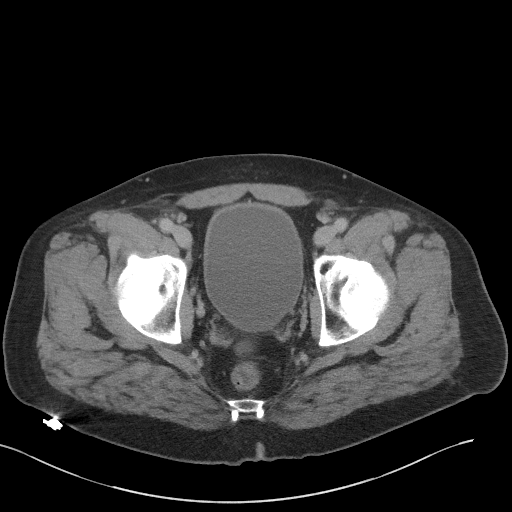
[im 28/101  soft-tissue]
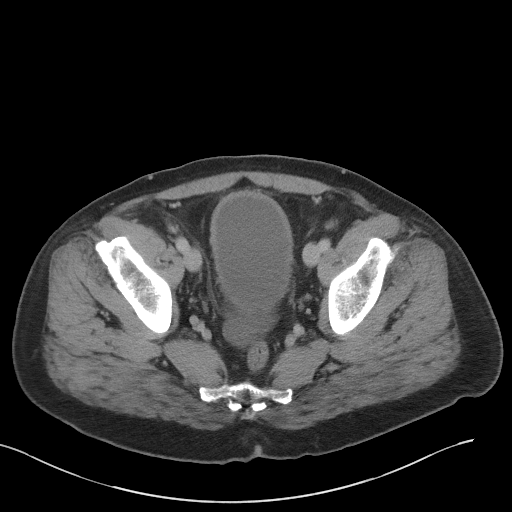
[im 34/101  soft-tissue]
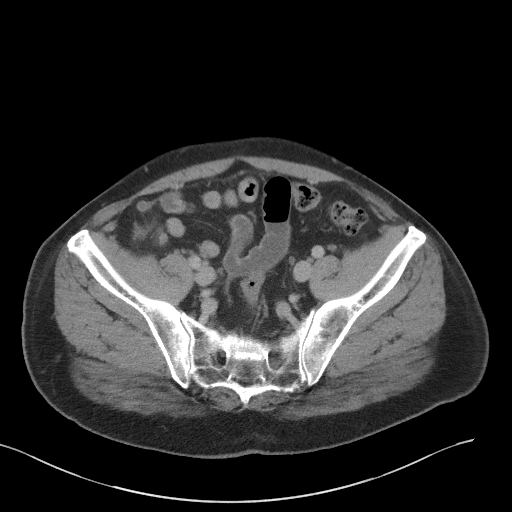
[im 45/101  soft-tissue]
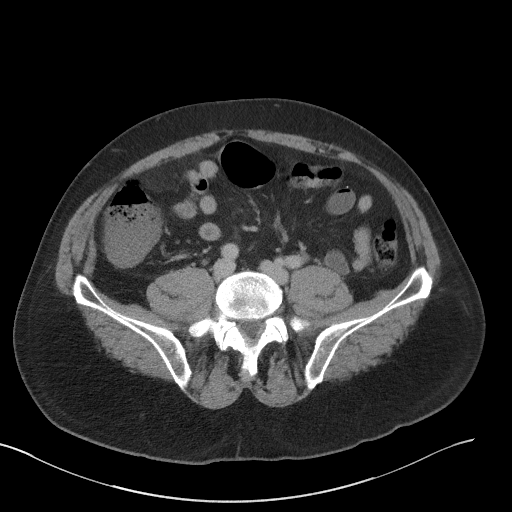
[im 51/101  soft-tissue]
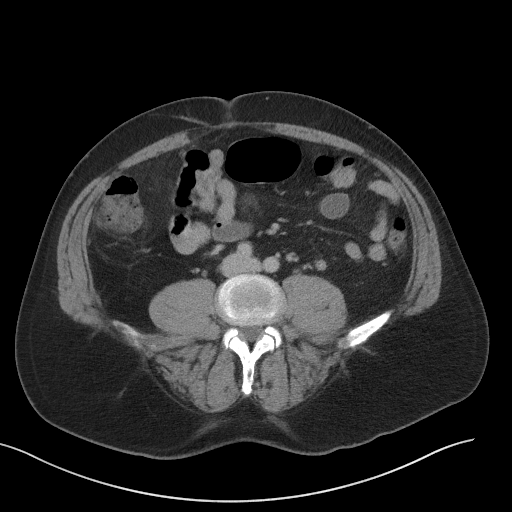
[im 56/101  soft-tissue]
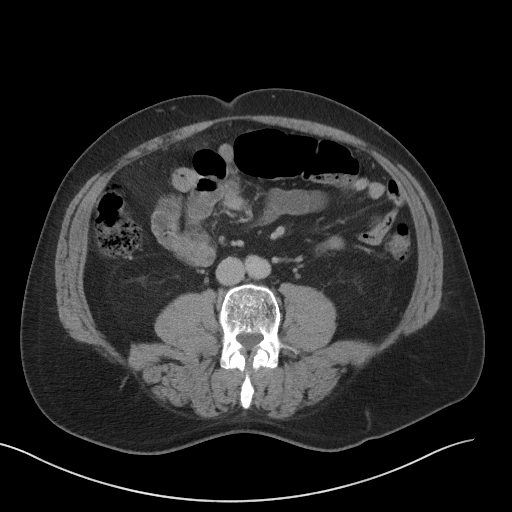
[im 67/101  soft-tissue]
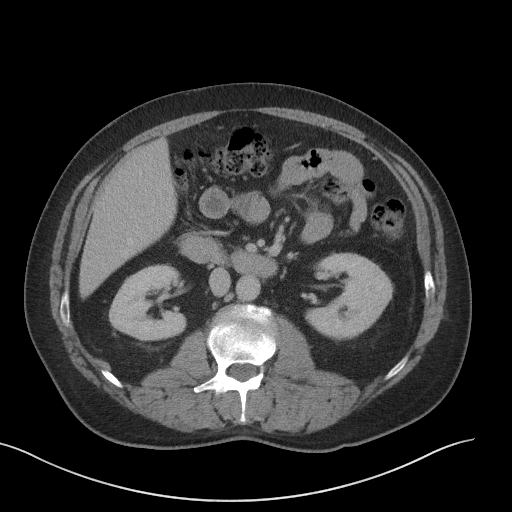
[im 67/101  bone]
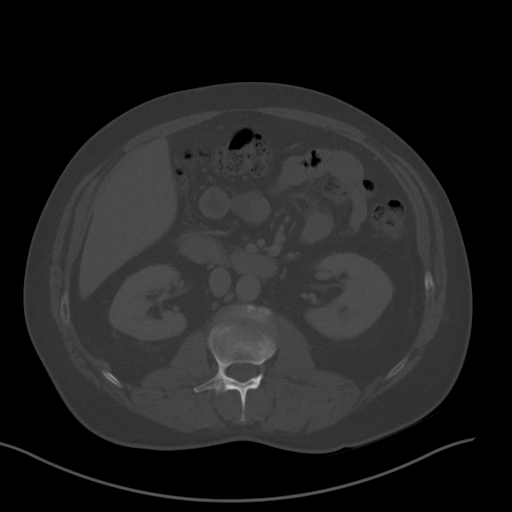
[im 73/101  soft-tissue]
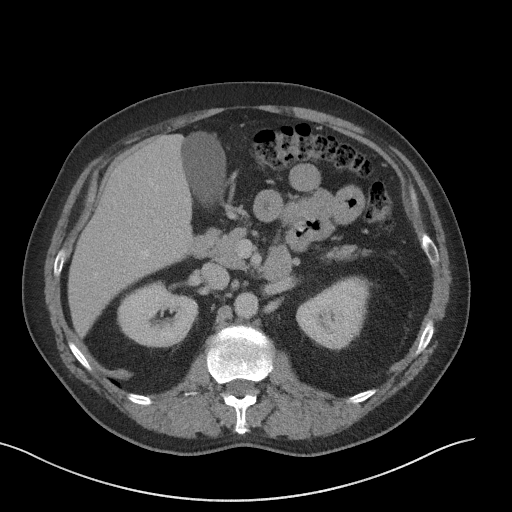
[im 78/101  soft-tissue]
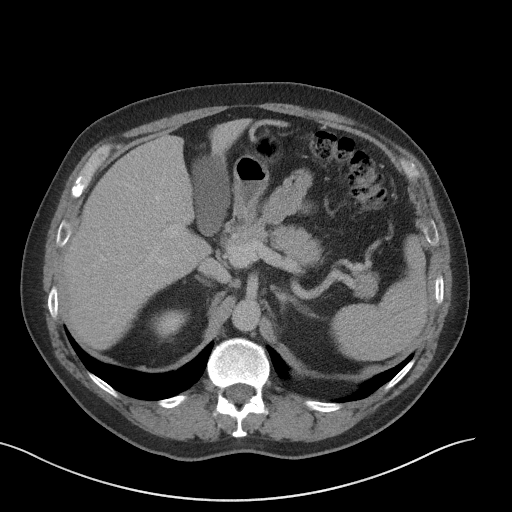
[im 89/101  soft-tissue]
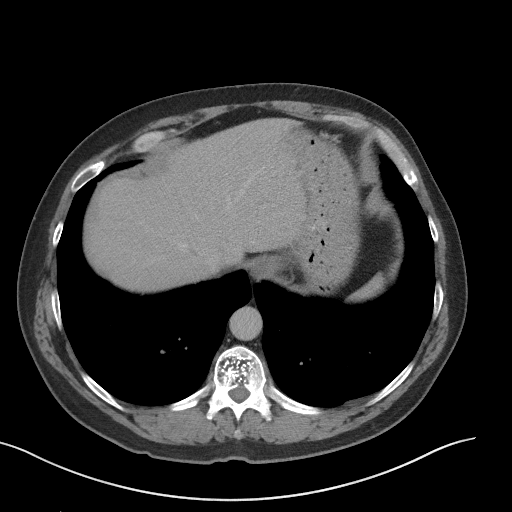
[im 95/101  soft-tissue]
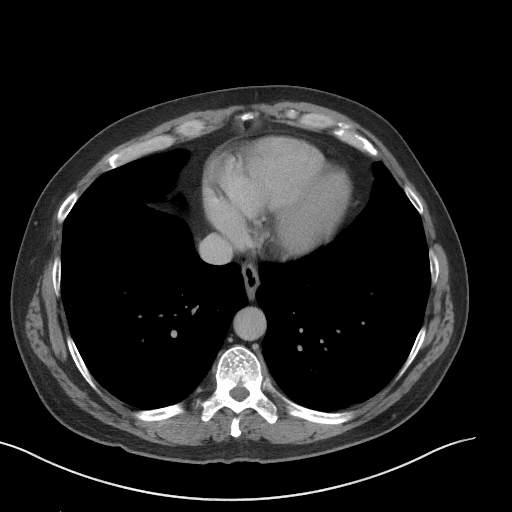

[Series 5: coronal st · coronal · 0.81mm/px · 3 of 103 slices shown]
[im 35/103  soft-tissue]
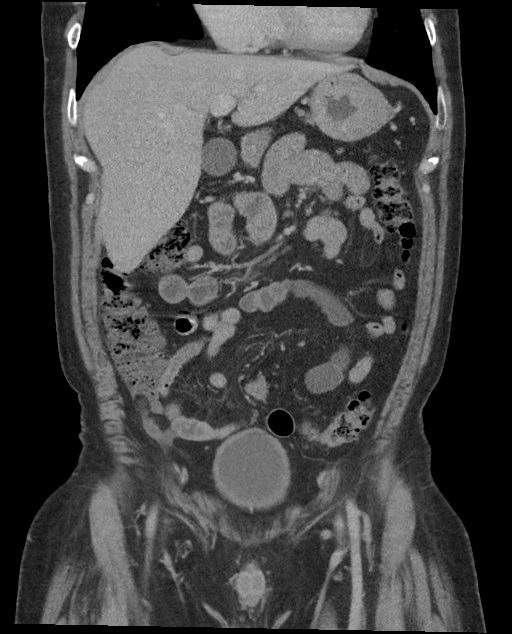
[im 46/103  soft-tissue]
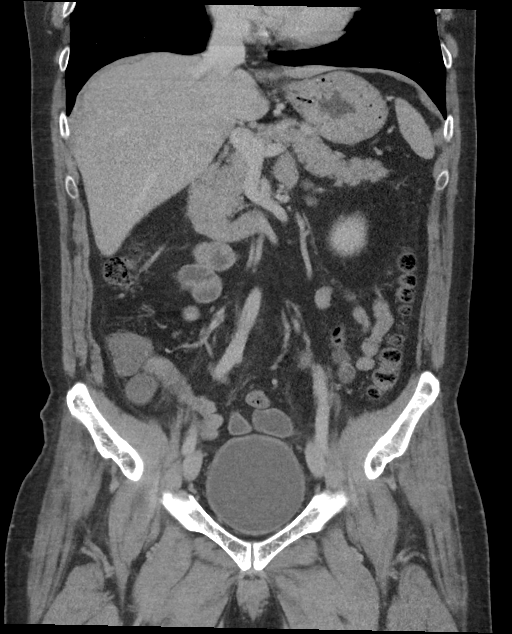
[im 57/103  soft-tissue]
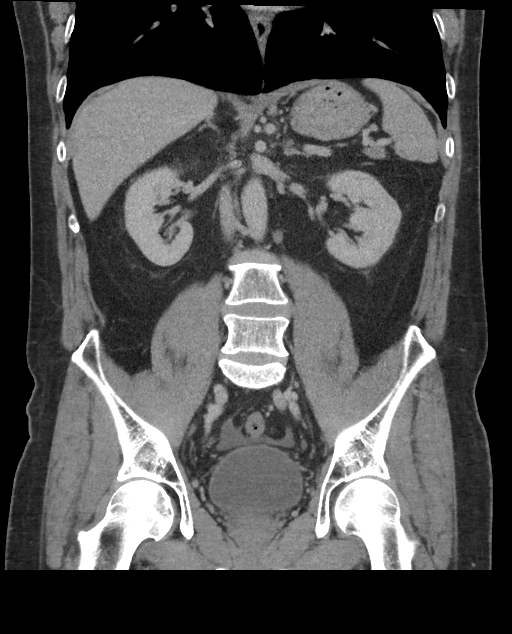

[16 of 46 positions shown; findings below may reference images not displayed]

FINDINGS: Lower chest: Lung bases are clear. No effusions. Heart is normal
size.

Hepatobiliary: No focal hepatic abnormality. Gallbladder
unremarkable.

Pancreas: No focal abnormality or ductal dilatation.

Spleen: No focal abnormality.  Normal size.

Adrenals/Urinary Tract: No adrenal abnormality. No focal renal
abnormality. No stones or hydronephrosis. Urinary bladder is
unremarkable.

Stomach/Bowel: Stomach, large and small bowel grossly unremarkable.
Appendix is dilated, measuring up to 9 mm. Surrounding inflammation
and fluid.

Vascular/Lymphatic: No evidence of aneurysm or adenopathy.

Reproductive: No visible focal abnormality.

Other: Small amount of free fluid in the cul-de-sac.  No free air.

Musculoskeletal: No acute bony abnormality.
IMPRESSION: Mildly distended appendix with surrounding inflammation and fluid
compatible with acute appendicitis. Small amount of free fluid in
the pelvis.

These results were called by telephone at the time of interpretation
on 05/25/2020 at [DATE] to provider IVAN ISRAEL KENEDY , who verbally
acknowledged these results.
